# Patient Record
Sex: Female | Born: 2005
Health system: Southern US, Community
[De-identification: ages and names within clinical notes are randomized; demographics above are authoritative.]

## PROBLEM LIST (undated history)

## (undated) DIAGNOSIS — T7840XA Allergy, unspecified, initial encounter: Secondary | ICD-10-CM

## (undated) DIAGNOSIS — K219 Gastro-esophageal reflux disease without esophagitis: Secondary | ICD-10-CM

## (undated) HISTORY — DX: Gastro-esophageal reflux disease without esophagitis: K21.9

## (undated) HISTORY — PX: FRACTURE SURGERY: SHX138

## (undated) HISTORY — DX: Allergy, unspecified, initial encounter: T78.40XA

## (undated) HISTORY — PX: WISDOM TOOTH EXTRACTION: SHX21

---
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
## 2024-05-21 ENCOUNTER — Ambulatory Visit: Admitting: Family Medicine

## 2024-05-21 ENCOUNTER — Other Ambulatory Visit (HOSPITAL_BASED_OUTPATIENT_CLINIC_OR_DEPARTMENT_OTHER): Payer: Self-pay

## 2024-05-21 ENCOUNTER — Ambulatory Visit: Payer: Self-pay | Admitting: Family Medicine

## 2024-05-21 ENCOUNTER — Encounter: Payer: Self-pay | Admitting: Family Medicine

## 2024-05-21 VITALS — BP 102/71 | HR 78 | Temp 98.0°F | Resp 18 | Ht 63.02 in | Wt 183.4 lb

## 2024-05-21 DIAGNOSIS — Z131 Encounter for screening for diabetes mellitus: Secondary | ICD-10-CM | POA: Diagnosis not present

## 2024-05-21 DIAGNOSIS — Z23 Encounter for immunization: Secondary | ICD-10-CM

## 2024-05-21 DIAGNOSIS — E7801 Familial hypercholesterolemia: Secondary | ICD-10-CM | POA: Diagnosis not present

## 2024-05-21 DIAGNOSIS — Z0000 Encounter for general adult medical examination without abnormal findings: Secondary | ICD-10-CM

## 2024-05-21 LAB — CBC WITH DIFFERENTIAL/PLATELET
Basophils Absolute: 0 K/uL (ref 0.0–0.1)
Basophils Relative: 0.4 % (ref 0.0–3.0)
Eosinophils Absolute: 0.2 K/uL (ref 0.0–0.7)
Eosinophils Relative: 4.1 % (ref 0.0–5.0)
HCT: 36.3 % (ref 36.0–49.0)
Hemoglobin: 12.5 g/dL (ref 12.0–16.0)
Lymphocytes Relative: 41.4 % (ref 24.0–48.0)
Lymphs Abs: 2.3 K/uL (ref 0.7–4.0)
MCHC: 34.5 g/dL (ref 31.0–37.0)
MCV: 80.3 fl (ref 78.0–98.0)
Monocytes Absolute: 0.2 K/uL (ref 0.1–1.0)
Monocytes Relative: 4.4 % (ref 3.0–12.0)
Neutro Abs: 2.8 K/uL (ref 1.4–7.7)
Neutrophils Relative %: 49.7 % (ref 43.0–71.0)
Platelets: 379 K/uL (ref 150.0–575.0)
RBC: 4.52 Mil/uL (ref 3.80–5.70)
RDW: 13.7 % (ref 11.4–15.5)
WBC: 5.6 K/uL (ref 4.5–13.5)

## 2024-05-21 LAB — COMPREHENSIVE METABOLIC PANEL WITH GFR
ALT: 8 U/L (ref 0–35)
AST: 14 U/L (ref 0–37)
Albumin: 4.5 g/dL (ref 3.5–5.2)
Alkaline Phosphatase: 74 U/L (ref 47–119)
BUN: 7 mg/dL (ref 6–23)
CO2: 27 meq/L (ref 19–32)
Calcium: 9.6 mg/dL (ref 8.4–10.5)
Chloride: 102 meq/L (ref 96–112)
Creatinine, Ser: 0.54 mg/dL (ref 0.40–1.20)
GFR: 133.62 mL/min (ref >60.00)
Glucose, Bld: 100 mg/dL — ABNORMAL HIGH (ref 70–99)
Potassium: 4.3 meq/L (ref 3.5–5.1)
Sodium: 136 meq/L (ref 135–145)
Total Bilirubin: 0.6 mg/dL (ref 0.2–1.2)
Total Protein: 7.5 g/dL (ref 6.0–8.3)

## 2024-05-21 LAB — LIPID PANEL
Cholesterol: 346 mg/dL — ABNORMAL HIGH (ref 0–200)
HDL: 48.6 mg/dL (ref >39.00)
LDL Cholesterol: 285 mg/dL — ABNORMAL HIGH (ref 0–99)
NonHDL: 297.55
Total CHOL/HDL Ratio: 7
Triglycerides: 61 mg/dL (ref 0.0–149.0)
VLDL: 12.2 mg/dL (ref 0.0–40.0)

## 2024-05-21 LAB — HEMOGLOBIN A1C: Hgb A1c MFr Bld: 5.2 % (ref 4.6–6.5)

## 2024-05-21 LAB — TSH: TSH: 0.79 u[IU]/mL (ref 0.40–5.00)

## 2024-05-21 MED ORDER — ROSUVASTATIN CALCIUM 5 MG PO TABS
5.0000 mg | ORAL_TABLET | Freq: Every day | ORAL | 3 refills | Status: AC
  Filled 2024-05-21: qty 90, 90d supply, fill #0

## 2024-05-21 NOTE — Progress Notes (Signed)
 Labs great except Cholesterol terrible!!!  Get back on meds(I sent). And work on diet/exercise  Sch appt in 3 months w/me to recheck.

## 2024-05-21 NOTE — Progress Notes (Signed)
 Phone [phone]   Subjective:   Patient is a 18 y.o. female presenting for annual physical.    Chief Complaint  Patient presents with   Establish Care    Initial visit to establish care with new pcp Fasting    Annual-not exercising Discussed the use of AI scribe software for clinical note transcription with the patient, who gave verbal consent to proceed.  History of Present Illness Marie Hamilton is a 18 year old female who presents for an annual physical exam.  She has a history of allergies and reflux, managed with Astelin , Zyrtec , Pepcid , Flonase , and naproxen  as needed. She also takes rosuvastatin  for cholesterol management but has been off it for a month or two due to running out of the medication. Her use of rosuvastatin  has been inconsistent over the years.  She experiences headaches approximately once every two weeks, which do not interfere with her daily activities. Ibuprofen  provides effective relief. The headaches are not associated with her menstrual cycle.  She reports episodes of feeling weak with a pulse of around 100 bpm, occurring about once a month or less. These episodes last about three minutes and are sometimes associated with dizziness and shortness of breath. They do not seem to be related to caffeine intake or fasting and may occur after eating a large meal.  She denies double vision, blurry vision (except without glasses), rashes, changing moles, coughing, wheezing, shortness of breath (except during episodes described above), asthma, nausea, vomiting, diarrhea, constipation, trouble urinating, irregular periods, muscle aches, joint pains, arthritis, depression, or suicidal thoughts.  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 She does not exercise regularly but walks to classes three times a week. She does not use drugs, drink alcohol, smoke, or vape. She is single with no children and has never been  sexually active.    See problem oriented charting- ROS- ROS: Gen: no fever, chills  Skin: no rash, itching ENT: no ear pain, ear drainage, nasal congestion, rhinorrhea, sinus pressure, sore throat Eyes: no blurry vision, double vision Resp: no cough, wheeze,SOB CV: no CP,, LE edema,   occ palp-symptoms and lasts few minutes.  GI: no heartburn, n/v/d/c, abd pain GU: no dysuria, urgency, frequency, hematuria MSK: no joint pain, myalgias, back pain Neuro: no dizziness,, weakness, vertigo.  Ha q 2 wks-ibu helps Psych: no depression, anxiety, insomnia, SI   The following were reviewed and entered/updated in epic: Past Medical History:  Diagnosis Date   Allergy  2023   GERD (gastroesophageal reflux disease) Jul 29 2023   Patient Active Problem List   Diagnosis Date Noted   Familial hypercholesteremia 02/22/2021   Obesity due to excess calories without serious comorbidity with body mass index (BMI) in 95th to 98th percentile for age in pediatric patient 02/22/2021   Overweight, pediatric, BMI 85.0-94.9 percentile for age 21/20/2019   Acne vulgaris 08/14/2017   Family history of hyperlipidemia 04/25/2016   Past Surgical History:  Procedure Laterality Date   FRACTURE SURGERY Left Apr 15 2014   wrist   WISDOM TOOTH EXTRACTION      Family History  Problem Relation Age of Onset   Hyperlipidemia Mother    Allergic rhinitis Father    Asthma Brother    Birth defects Brother        spina bifida   Learning disabilities Brother    Intellectual disability Brother    Allergic rhinitis Maternal Aunt    Hyperlipidemia Maternal Aunt  Diabetes Maternal Aunt    Miscarriages / Stillbirths Maternal Aunt    Allergic rhinitis Maternal Aunt    Hyperlipidemia Maternal Aunt    Heart disease Maternal Aunt        rheumatic heart disease   Allergic rhinitis Maternal Uncle    Heart disease Maternal Uncle    Asthma Maternal Grandfather     Medications- reviewed and updated Current Outpatient  Medications  Medication Sig Dispense Refill   azelastine  (ASTELIN ) 0.1 % nasal spray Place 2 sprays into both nostrils 2 (two) times daily as needed. Use in each nostril as directed (Patient taking differently: Place 2 sprays into both nostrils as needed. Use in each nostril as directed) 30 mL 5   cetirizine  (ZYRTEC  ALLERGY ) 10 MG tablet Take 1 tablet (10 mg total) by mouth daily. (Patient taking differently: Take 10 mg by mouth as needed.) 30 tablet 5   famotidine  (PEPCID ) 20 MG tablet Take 1 tablet (20 mg total) by mouth 2 (two) times daily. (Patient taking differently: Take 20 mg by mouth as needed.) 60 tablet 1   fluticasone  (FLONASE ) 50 MCG/ACT nasal spray Place 2 sprays into both nostrils daily. (Patient taking differently: Place 2 sprays into both nostrils as needed.) 16 g 5   naproxen  sodium (ALEVE ) 220 MG tablet Take 220 mg by mouth. (Patient taking differently: Take 220 mg by mouth as needed.)     rosuvastatin  (CRESTOR ) 5 MG tablet Take 1 tablet (5 mg total) by mouth daily. 90 tablet 3   No current facility-administered medications for this visit.    Allergies-reviewed and updated No Known Allergies  Social History   Social History Narrative   Live with mom dad and brother   Cat   Watch videos on computer or phone.       Studying bio   Objective  Objective:  BP 102/71   Pulse 78   Temp 98 F (36.7 C) (Temporal)   Resp 18   Ht 5' 3.02 (1.601 m)   Wt 183 lb 6 oz (83.2 kg)   SpO2 97%   BMI 32.46 kg/m  Physical Exam  Gen: WDWN NAD HEENT: NCAT, conjunctiva not injected, sclera nonicteric TM WNL B, OP moist, no exudates  NECK:  supple, no thyromegaly, no nodes, no carotid bruits CARDIAC: RRR, S1S2+, no murmur. DP 2+B LUNGS: CTAB. No wheezes ABDOMEN:  BS+, soft, NTND, No HSM, no masses EXT:  no edema MSK: no gross abnormalities. MS 5/5 all 4 NEURO: A&O x3.  CN II-XII intact.  PSYCH: normal mood. Good eye contact      Assessment and Plan   Health Maintenance  counseling: 1. Anticipatory guidance: Patient counseled regarding regular dental exams q6 months, eye exams,  avoiding smoking and second hand smoke, limiting alcohol to 1 beverage per day, no illicit drugs.   2. Risk factor reduction:  Advised patient of need for regular exercise and diet rich and fruits and vegetables to reduce risk of heart attack and stroke. Exercise- encouraged.  Wt Readings from Last 3 Encounters:  05/21/24 183 lb 6 oz (83.2 kg) (95%, Z= 1.68)*  11/06/23 180 lb 11.2 oz (82 kg) (95%, Z= 1.66)*  10/28/23 182 lb 14.4 oz (83 kg) (96%, Z= 1.70)*   * Growth percentiles are based on CDC (Girls, 2-20 Years) data.   3. Immunizations/screenings/ancillary studies Immunization History  Administered Date(s) Administered   DTaP 04/21/2005, 06/25/2005, 08/20/2005, 07/17/2006, 03/10/2009   Dtap, Unspecified 04/21/2005, 06/25/2005, 08/20/2005   H1N1 08/08/2008   HIB (PRP-OMP)  04/25/2005, 06/25/2005, 04/15/2006   HPV 9-valent 04/25/2016, 10/03/2016   Hepatitis A 07/17/2006, 02/13/2007   Hepatitis A, Ped/Adol-2 Dose 07/17/2006, 02/13/2007   Hepatitis B 06-16-2005, 03/21/2005, 08/20/2005   IPV 04/25/2005, 06/25/2005, 07/17/2006, 03/10/2009   Influenza, Seasonal, Injecte, Preservative Fre 07/29/2023   Influenza,Quad,Nasal, Live 06/06/2012, 06/01/2013, 06/22/2014   Influenza,inj,Quad PF,6+ Mos 06/14/2015, 05/21/2016, 06/13/2017, 05/20/2018, 05/08/2019, 06/17/2020, 05/31/2022   Influenza-Unspecified 08/20/2005, 09/21/2005, 07/17/2006, 07/25/2007, 08/08/2008, 06/17/2009, 06/02/2010, 07/13/2011, 06/06/2012, 06/01/2013, 06/22/2014   MMR 04/15/2006, 03/10/2009   MenQuadfi_Meningococcal Groups ACYW Conjugate 05/31/2022   Meningococcal Conjugate 04/25/2016   PFIZER(Purple Top)SARS-COV-2 Vaccination 02/07/2020, 02/26/2020   Pneumococcal Conjugate PCV 7 04/25/2005, 06/25/2005, 08/20/2005, 04/15/2006   Pneumococcal Conjugate-13 04/25/2005, 06/25/2005, 08/20/2005, 04/15/2006, 03/10/2009   Tdap  04/25/2016   Varicella 04/15/2006, 03/10/2009   Health Maintenance Due  Topic Date Due   Meningococcal B Vaccine (1 of 2 - Standard) Never done   Hepatitis C Screening  Never done    4. Cervical cancer screening: n/a 5. Skin cancer screening- advised regular sunscreen use. Denies worrisome, changing, or new skin lesions.  6. Birth control/STD check: not SA 7. Smoking associated screening: never smoker 8. Alcohol screening: neg  Wellness examination -     Lipid panel -     Comprehensive metabolic panel with GFR -     CBC with Differential/Platelet -     Hemoglobin A1c -     TSH  Need for meningococcal vaccination -     Meningococcal B, OMV  Need for influenza vaccination -     Flu vaccine trivalent PF, 6mos and older(Flulaval,Afluria,Fluarix,Fluzone)  Familial hypercholesteremia -     Rosuvastatin  Calcium ; Take 1 tablet (5 mg total) by mouth daily.  Dispense: 90 tablet; Refill: 3   Wellness-anticipatory guidance.  Work on Diet/Exercise  Check CBC,CMP,lipids,TSH, A1C.  F/u 1 yr .  Meningo B given Assessment and Plan Assessment & Plan Adult Wellness Visit   She underwent an annual physical examination to establish care, reporting no significant health issues. Lifestyle modifications, including exercise and diet, were discussed. Administer meningitis and flu vaccines. Encourage regular exercise, aiming for 30 minutes of activity 5 days a week. Advise on healthy eating habits. Schedule annual wellness visit.  Palpitations with presyncope, episodic   She experiences episodic palpitations with presyncope once a month or less, lasting about 3 minutes, with symptoms of weakness and dizziness. No clear triggers identified. Discussed potential benefits of hydration and electrolyte supplementation. Advise logging episodes with details of diet, hydration, and menstrual cycle. Consider adding electrolytes to water once daily. Advise on coughing technique to potentially stop  episodes.  Hypercholesterolemia   Managed with rosuvastatin , which she has been off for 1-2 months due to running out. Discussed the importance of medication adherence and potential need to discontinue if considering pregnancy in the future. Refill rosuvastatin  prescription. Educate on the importance of medication adherence. Discuss potential need to discontinue rosuvastatin  if considering pregnancy.  Recurrent headaches   Headaches occur approximately once every two weeks, manageable with ibuprofen , with no significant impact on daily activities. Discussed potential benefit of magnesium supplementation. Consider magnesium supplementation daily.  Seasonal allergic rhinitis   Managed with Astelin , Zyrtec , and Flonase  as needed.  Gastroesophageal reflux disease   Managed with Pepcid  as needed. No changes in symptoms reported.    Recommended follow up: Return in about 1 year (around 05/21/2025) for annual physical.  Lab/Order associations:+ fasting   Jenkins CHRISTELLA Carrel, MD

## 2024-05-21 NOTE — Patient Instructions (Signed)
 Welcome to [facility]! It was a pleasure meeting you today.  As discussed, Please schedule a 12 month follow up visit today.  Log episodes of heart.  Try hard cough to stop it  Electrolytes to 1 water/day.   Consider taking magnesium daily  PLEASE NOTE:  If you had any LAB tests please let us  know if you have not heard back within a few days. You may see your results on MyChart before we have a chance to review them but we will give you a call once they are reviewed by us . If we ordered any REFERRALS today, please let us  know if you have not heard from their office within the next week.  Let us  know through MyChart if you are needing REFILLS, or have your pharmacy send us  the request. You can also use MyChart to communicate with me or any office staff.  Please try these tips to maintain a healthy lifestyle:  Eat most of your calories during the day when you are active. Eliminate processed foods including packaged sweets (pies, cakes, cookies), reduce intake of potatoes, white bread, white pasta, and white rice. Look for whole grain options, oat flour or almond flour.  Each meal should contain half fruits/vegetables, one quarter protein, and one quarter carbs (no bigger than a computer mouse).  Cut down on sweet beverages. This includes juice, soda, and sweet tea. Also watch fruit intake, though this is a healthier sweet option, it still contains natural sugar! Limit to 3 servings daily.  Drink at least 1 glass of water with each meal and aim for at least 8 glasses per day  Exercise at least 150 minutes every week.

## 2024-06-25 ENCOUNTER — Ambulatory Visit (HOSPITAL_BASED_OUTPATIENT_CLINIC_OR_DEPARTMENT_OTHER)
Admission: RE | Admit: 2024-06-25 | Discharge: 2024-06-25 | Disposition: A | Discharge status: Home / Self Care | Source: Ambulatory Visit | Attending: Family Medicine | Admitting: Family Medicine

## 2024-06-25 ENCOUNTER — Other Ambulatory Visit: Payer: Self-pay

## 2024-06-25 ENCOUNTER — Encounter: Payer: Self-pay | Admitting: Family Medicine

## 2024-06-25 ENCOUNTER — Ambulatory Visit: Admitting: Family Medicine

## 2024-06-25 ENCOUNTER — Ambulatory Visit: Payer: Self-pay | Admitting: Family Medicine

## 2024-06-25 VITALS — BP 108/60 | HR 81 | Temp 97.9°F | Ht 63.02 in | Wt 187.2 lb

## 2024-06-25 DIAGNOSIS — R1031 Right lower quadrant pain: Secondary | ICD-10-CM | POA: Diagnosis not present

## 2024-06-25 DIAGNOSIS — R109 Unspecified abdominal pain: Secondary | ICD-10-CM | POA: Diagnosis not present

## 2024-06-25 LAB — COMPREHENSIVE METABOLIC PANEL WITH GFR
ALT: 8 U/L (ref 0–35)
AST: 15 U/L (ref 0–37)
Albumin: 4.6 g/dL (ref 3.5–5.2)
Alkaline Phosphatase: 79 U/L (ref 47–119)
BUN: 7 mg/dL (ref 6–23)
CO2: 26 meq/L (ref 19–32)
Calcium: 9.2 mg/dL (ref 8.4–10.5)
Chloride: 101 meq/L (ref 96–112)
Creatinine, Ser: 0.49 mg/dL (ref 0.40–1.20)
GFR: 136.7 mL/min (ref >60.00)
Glucose, Bld: 96 mg/dL (ref 70–99)
Potassium: 4.1 meq/L (ref 3.5–5.1)
Sodium: 137 meq/L (ref 135–145)
Total Bilirubin: 0.7 mg/dL (ref 0.2–1.2)
Total Protein: 7.8 g/dL (ref 6.0–8.3)

## 2024-06-25 LAB — POC URINALSYSI DIPSTICK (AUTOMATED)
Bilirubin, UA: NEGATIVE
Blood, UA: NEGATIVE
Clarity, UA: NORMAL
Color, UA: NORMAL
Glucose, UA: NEGATIVE
Ketones, UA: NEGATIVE
Nitrite, UA: NEGATIVE
Protein, UA: POSITIVE — AB
Spec Grav, UA: 1.02 (ref 1.010–1.025)
Urobilinogen, UA: 0.2 U/dL
pH, UA: 6 (ref 5.0–8.0)

## 2024-06-25 LAB — CBC WITH DIFFERENTIAL/PLATELET
Basophils Absolute: 0 K/uL (ref 0.0–0.1)
Basophils Relative: 0.4 % (ref 0.0–3.0)
Eosinophils Absolute: 0.3 K/uL (ref 0.0–0.7)
Eosinophils Relative: 4.8 % (ref 0.0–5.0)
HCT: 38.7 % (ref 36.0–49.0)
Hemoglobin: 12.9 g/dL (ref 12.0–16.0)
Lymphocytes Relative: 41.5 % (ref 24.0–48.0)
Lymphs Abs: 2.4 K/uL (ref 0.7–4.0)
MCHC: 33.5 g/dL (ref 31.0–37.0)
MCV: 81.1 fl (ref 78.0–98.0)
Monocytes Absolute: 0.3 K/uL (ref 0.1–1.0)
Monocytes Relative: 4.9 % (ref 3.0–12.0)
Neutro Abs: 2.8 K/uL (ref 1.4–7.7)
Neutrophils Relative %: 48.4 % (ref 43.0–71.0)
Platelets: 369 K/uL (ref 150.0–575.0)
RBC: 4.77 Mil/uL (ref 3.80–5.70)
RDW: 13.8 % (ref 11.4–15.5)
WBC: 5.7 K/uL (ref 4.5–13.5)

## 2024-06-25 LAB — POCT URINE PREGNANCY: Preg Test, Ur: NEGATIVE

## 2024-06-25 MED ORDER — IOHEXOL 300 MG/ML  SOLN
100.0000 mL | Freq: Once | INTRAMUSCULAR | Status: AC | PRN
  Administered 2024-06-25: 100 mL via INTRAVENOUS

## 2024-06-25 MED ORDER — CEPHALEXIN 500 MG PO CAPS
500.0000 mg | ORAL_CAPSULE | Freq: Two times a day (BID) | ORAL | 0 refills | Status: AC
  Filled 2024-06-25: qty 14, 7d supply, fill #0

## 2024-06-25 NOTE — Telephone Encounter (Signed)
 Spoke with pt and relayed lab results and notes per PCP. Sending in Keflex 500mg  for 7days.

## 2024-06-25 NOTE — Addendum Note (Signed)
 Addended by: Micaylah Bertucci Y on: 06/25/2024 01:26 PM   Modules accepted: Orders

## 2024-06-25 NOTE — Progress Notes (Signed)
 Labs and ct ok.  Send keflex 500mg  bid as wbc in urine.  Take ibuprofen  400-600mg  every 6 hrs or so.

## 2024-06-25 NOTE — Addendum Note (Signed)
 Addended by: Arleigh Dicola Y on: 06/25/2024 12:58 PM   Modules accepted: Orders

## 2024-06-25 NOTE — Addendum Note (Signed)
 Addended by: Cardale Dorer Y on: 06/25/2024 12:56 PM   Modules accepted: Orders

## 2024-06-25 NOTE — Progress Notes (Signed)
 Subjective:     Patient ID: Marie Hamilton, female    DOB: 2006/07/04, 18 y.o.   MRN: [id number]  Chief Complaint  Patient presents with   Flank Pain    R side; about 4-5 days felt sore; tender to touch; past 2 days radiated around but now back to side; no accident or injury; nothing done or eaten differently     Discussed the use of AI scribe software for clinical note transcription with the patient, who gave verbal consent to proceed.  History of Present Illness Marie Hamilton is a 18 year old female who presents with right-sided flank pain.  She has been experiencing right-sided flank pain for the past four to five days, which began suddenly at night while she was in bed. The pain is sharp and burning, with episodes lasting approximately 30 minutes. It is exacerbated by certain movements, such as sleeping on her side and stretching, and she rates it as a 7 to 8 out of 10.  She denies any recent injuries, falls, or changes in physical activity. 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 She has not engaged in activities such as moving furniture or heavy lifting.  She notes a burning sensation down the side of her right leg when it is touched or rubbed, but no numbness or tingling. No urinary symptoms such as burning during urination or hematuria.  Her menstrual cycle is irregular, with her last period on September 17th. She is not sexually active and denies any possibility of pregnancy.  She took ibuprofen  on the first day of her symptoms, but it did not alleviate the pain. She has not taken any other medications since then.    Health Maintenance Due  Topic Date Due   Hepatitis C Screening  Never done    Past Medical History:  Diagnosis Date   Allergy  2023   GERD (gastroesophageal reflux disease) Jul 29 2023    Past Surgical History:  Procedure Laterality Date   FRACTURE SURGERY Left Apr 15 2014   wrist   WISDOM TOOTH EXTRACTION       Current Outpatient Medications:     azelastine  (ASTELIN ) 0.1 % nasal spray, Place 2 sprays into both nostrils 2 (two) times daily as needed. Use in each nostril as directed (Patient taking differently: Place 2 sprays into both nostrils as needed. Use in each nostril as directed), Disp: 30 mL, Rfl: 5   cetirizine  (ZYRTEC  ALLERGY ) 10 MG tablet, Take 1 tablet (10 mg total) by mouth daily. (Patient taking differently: Take 10 mg by mouth as needed.), Disp: 30 tablet, Rfl: 5   famotidine  (PEPCID ) 20 MG tablet, Take 1 tablet (20 mg total) by mouth 2 (two) times daily. (Patient taking differently: Take 20 mg by mouth as needed.), Disp: 60 tablet, Rfl: 1   fluticasone  (FLONASE ) 50 MCG/ACT nasal spray, Place 2 sprays into both nostrils daily. (Patient taking differently: Place 2 sprays into both nostrils as needed.), Disp: 16 g, Rfl: 5   naproxen  sodium (ALEVE ) 220 MG tablet, Take 220 mg by mouth. (Patient taking differently: Take 220 mg by mouth as needed.), Disp: , Rfl:    rosuvastatin  (CRESTOR ) 5 MG tablet, Take 1 tablet (5 mg total) by mouth daily., Disp: 90 tablet, Rfl: 3  No Known Allergies ROS neg/noncontributory except as noted HPI/below      Objective:     BP 108/60   Pulse 81   Temp 97.9 F (36.6 C)   Ht 5' 3.02 (1.601 m)  Wt 187 lb 3.2 oz (84.9 kg)   SpO2 98%   BMI 33.14 kg/m  Wt Readings from Last 3 Encounters:  06/25/24 187 lb 3.2 oz (84.9 kg) (96%, Z= 1.75)*  05/21/24 183 lb 6 oz (83.2 kg) (95%, Z= 1.68)*  11/06/23 180 lb 11.2 oz (82 kg) (95%, Z= 1.66)*   * Growth percentiles are based on CDC (Girls, 2-20 Years) data.    Physical Exam GENERAL: Well developed, well nourished, no acute distress. HEAD EYES EARS NOSE THROAT: Normocephalic, atraumatic, conjunctiva not injected, sclera nonicteric. CARDIAC: Regular rate and rhythm, S1 S2 present, no murmur, dorsalis pedis 2 plus bilaterally. NECK: Supple, no thyromegaly, no nodes, no carotid bruits. LUNGS: Clear to auscultation bilaterally, no  wheezes. ABDOMEN: Bowel sounds present, soft, diffuse tenderness in right abdomen, no hepatosplenomegaly, no masses. EXTREMITIES: No edema. MUSCULOSKELETAL: Spine normal range of motion, tenderness in right lower spine muscles, no gross abnormalities.  NEUROLOGICAL: Alert and oriented x3, cranial nerves II through XII intact. PSYCHIATRIC: Normal mood, good eye contact. SKIN: No rash on back.  Results for orders placed or performed in visit on 06/25/24  POCT Urinalysis Dipstick (Automated)   Collection Time: 06/25/24 12:22 PM  Result Value Ref Range   Color, UA normal    Clarity, UA normal    Glucose, UA Negative Negative   Bilirubin, UA negative    Ketones, UA negative    Spec Grav, UA 1.020 1.010 - 1.025   Blood, UA negative    pH, UA 6.0 5.0 - 8.0   Protein, UA Positive (A) Negative   Urobilinogen, UA 0.2 0.2 or 1.0 E.U./dL   Nitrite, UA negative    Leukocytes, UA Moderate (2+) (A) Negative         Assessment & Plan:  Right lower quadrant abdominal pain -     POCT Urinalysis Dipstick (Automated) -     CBC with Differential/Platelet -     Comprehensive metabolic panel with GFR -     CT ABDOMEN PELVIS W CONTRAST; Future    Assessment and Plan Assessment & Plan Right flank and abdominal pain, etiology undetermined   has not eaten yet today She experiences intermittent right-sided flank and abdominal pain for 4-5 days, described as sharp and burning, with a severity of 7-8 out of 10. Pain worsens with certain movements and positions, such as sleeping on the side and stretching. There are no associated urinary symptoms, nausea, vomiting, or diarrhea. Differential diagnosis includes nephrolithiasis, pyelonephritis, appendicitis, or musculoskeletal pain. No rash is present to suggest herpes zoster, and pregnancy is ruled out as she is not sexually active. Order urinalysis to check for hematuria or infection and blood work. Determine the type of CT scan needed based on urinalysis  results. Consider regular use of ibuprofen  or acetaminophen  for pain relief. Advise increased fluid intake if nephrolithiasis is suspected. Discuss potential need for surgical intervention if appendicitis is confirmed.       Return if symptoms worsen or fail to improve.  Jenkins CHRISTELLA Carrel, MD

## 2024-06-25 NOTE — Patient Instructions (Signed)
 CT  Tylenol /motrin 

## 2024-06-26 LAB — URINE CULTURE
MICRO NUMBER:: 17144757
Result:: NO GROWTH
SPECIMEN QUALITY:: ADEQUATE

## 2024-06-27 NOTE — Progress Notes (Signed)
 Urine culture negative.  How is she doing?

## 2024-06-28 ENCOUNTER — Other Ambulatory Visit: Payer: Self-pay

## 2025-05-23 ENCOUNTER — Encounter: Admitting: Family Medicine
